# Patient Record
Sex: Female | Born: 2000 | Race: White | Hispanic: No | Marital: Single
Health system: Southern US, Community
[De-identification: ages and names within clinical notes are randomized; demographics above are authoritative.]

---
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
## 2020-12-08 ENCOUNTER — Emergency Department (HOSPITAL_COMMUNITY): Payer: No Typology Code available for payment source

## 2020-12-08 ENCOUNTER — Other Ambulatory Visit: Payer: Self-pay

## 2020-12-08 ENCOUNTER — Emergency Department (HOSPITAL_COMMUNITY)
Admission: EM | Admit: 2020-12-08 | Discharge: 2020-12-08 | Disposition: A | Discharge status: Home / Self Care | Payer: No Typology Code available for payment source | Attending: Emergency Medicine | Admitting: Emergency Medicine

## 2020-12-08 DIAGNOSIS — Y93K9 Activity, other involving animal care: Secondary | ICD-10-CM | POA: Insufficient documentation

## 2020-12-08 DIAGNOSIS — S61431A Puncture wound without foreign body of right hand, initial encounter: Secondary | ICD-10-CM | POA: Insufficient documentation

## 2020-12-08 DIAGNOSIS — W540XXA Bitten by dog, initial encounter: Secondary | ICD-10-CM | POA: Diagnosis not present

## 2020-12-08 DIAGNOSIS — Y990 Civilian activity done for income or pay: Secondary | ICD-10-CM | POA: Diagnosis not present

## 2020-12-08 DIAGNOSIS — S6991XA Unspecified injury of right wrist, hand and finger(s), initial encounter: Secondary | ICD-10-CM | POA: Diagnosis present

## 2020-12-08 MED ORDER — POVIDONE-IODINE 10 % EX SOLN
CUTANEOUS | Status: DC | PRN
  Filled 2020-12-08: qty 30

## 2020-12-08 MED ORDER — AMOXICILLIN-POT CLAVULANATE 875-125 MG PO TABS
1.0000 | ORAL_TABLET | Freq: Once | ORAL | Status: AC
  Administered 2020-12-08: 1 via ORAL
  Filled 2020-12-08: qty 1

## 2020-12-08 MED ORDER — AMOXICILLIN-POT CLAVULANATE 875-125 MG PO TABS: 1.0000 | ORAL_TABLET | Freq: Two times a day (BID) | ORAL | 0 refills | Status: AC

## 2020-12-08 NOTE — Discharge Instructions (Addendum)
Take the entire course of the antibiotics prescribed.  I recommend mild soapy water wash twice daily of your wounds followed by bandages to keep them clean and covered.  Also recommend ibuprofen or Tylenol for symptom relief as needed.

## 2020-12-08 NOTE — ED Triage Notes (Addendum)
Pt. Was bit by a dog at work. Pts. Right hand has a puncture wound in between the thumb and finger with bleeding controlled at this time. Pt. States dog has had the rabies vaccine.

## 2020-12-09 NOTE — ED Provider Notes (Signed)
[facility] EMERGENCY DEPARTMENT Provider Note   CSN: [id number] Arrival date & time: 12/08/20  1144     History Chief Complaint  Patient presents with   Animal Bite    Marilyn Thomas is a 20 y.o. female.  The history is provided by the patient.  Animal Bite Contact animal:  Dog Location:  Hand Hand injury location:  R hand (puncture wound between thumb and index finger web space) Pain details:    Quality:  Aching and localized   Severity:  Mild   Timing:  Constant   Progression:  Unchanged Provoked: provoked   Notifications:  Health department (Pt works as a Museum/gallery conservator, was bit when attempting to remove dog from an exam room back to his kennel.) Animal's rabies vaccination status:  Up to date Animal in possession: yes   Tetanus status:  Up to date Relieved by:  None tried Worsened by:  Nothing Ineffective treatments:  None tried Associated symptoms: no fever, no numbness and no swelling       No past medical history on file.  There are no problems to display for this patient.    The histories are not reviewed yet. Please review them in the "History" navigator section and refresh this SmartLink.   OB History   No obstetric history on file.     No family history on file.     Home Medications Prior to Admission medications   Medication Sig Start Date End Date Taking? Authorizing Provider  amoxicillin-clavulanate (AUGMENTIN) 875-125 MG tablet Take 1 tablet by mouth every 12 (twelve) hours. 12/08/20  Yes IdolRaynelle Fanning, PA-C    Allergies    Patient has no allergy information on record.  Review of Systems   Review of Systems  Constitutional:  Negative for chills and fever.  Respiratory: Negative.    Cardiovascular: Negative.   Skin:  Positive for wound.  Neurological:  Negative for weakness and numbness.   Physical Exam Updated Vital Signs BP 122/73   Pulse 71   Temp 98.7 F (37.1 C) (Oral)   Resp 18   Ht 5\' 3"  (1.6 m)   Wt 74.8 kg   SpO2 100%    BMI 29.23 kg/m   Physical Exam Constitutional:      Appearance: She is well-developed.  HENT:     Head: Normocephalic.  Cardiovascular:     Rate and Rhythm: Normal rate.  Pulmonary:     Effort: Pulmonary effort is normal.  Musculoskeletal:        General: No swelling or deformity.  Skin:    Findings: Laceration present. No erythema.     Comments: Small puncture wound right web space between thumb and index finger, hemostatic,  no palpable deformity/foreign body.  Several superficial abrasions around the wound site.   Neurological:     Mental Status: She is alert and oriented to person, place, and time.     Sensory: No sensory deficit.     Motor: No weakness.     Comments: No weakness in hand or fingers, FROM without deficit. Distal sensation intact with less than 2 sec cap refill in fingertips.    ED Results / Procedures / Treatments   Labs (all labs ordered are listed, but only abnormal results are displayed) Labs Reviewed - No data to display  EKG None  Radiology DG Hand Complete Right  Result Date: 12/08/2020 CLINICAL DATA:  Dog bite EXAM: RIGHT HAND - COMPLETE 3+ VIEW COMPARISON:  None. FINDINGS: There is no acute  fracture or dislocation. Alignment is normal. The joint spaces are preserved. The soft tissues are normal. There is no soft tissue gas. No radiopaque foreign body is seen. IMPRESSION: Normal hand radiographs. Electronically Signed   By: Lesia Hausen MD   On: 12/08/2020 15:23    Procedures Procedures   Medications Ordered in ED Medications  amoxicillin-clavulanate (AUGMENTIN) 875-125 MG per tablet 1 tablet (1 tablet Oral Given 12/08/20 1542)    ED Course  I have reviewed the triage vital signs and the nursing notes.  Pertinent labs & imaging results that were available during my care of the patient were reviewed by me and considered in my medical decision making (see chart for details).    MDM Rules/Calculators/A&P                           Imaging  reviewed with no fb or fx. Hand was soaked in betadine/saline solution, puncture flushed with saline.  No sutures indicated or recommended.  Discussed home care, dressings, augmentin started.  Pt is current with tetanus.  Strict return precautions and/or f/u with Dr. Romeo Apple for any complications with this wound as it heals. Final Clinical Impression(s) / ED Diagnoses Final diagnoses:  Dog bite, initial encounter    Rx / DC Orders ED Discharge Orders          Ordered    amoxicillin-clavulanate (AUGMENTIN) 875-125 MG tablet  Every 12 hours        12/08/20 1529             Victoriano Lain 12/09/20 1908    Pollyann Savoy, MD 12/10/20 1155

## 2022-01-02 IMAGING — DX DG HAND COMPLETE 3+V*R*
3 series · 3 of 3 positions shown · non-contrast
Comparison: None.

CLINICAL DATA: Dog bite

EXAM:
RIGHT HAND - COMPLETE 3+ VIEW

[hand pa]
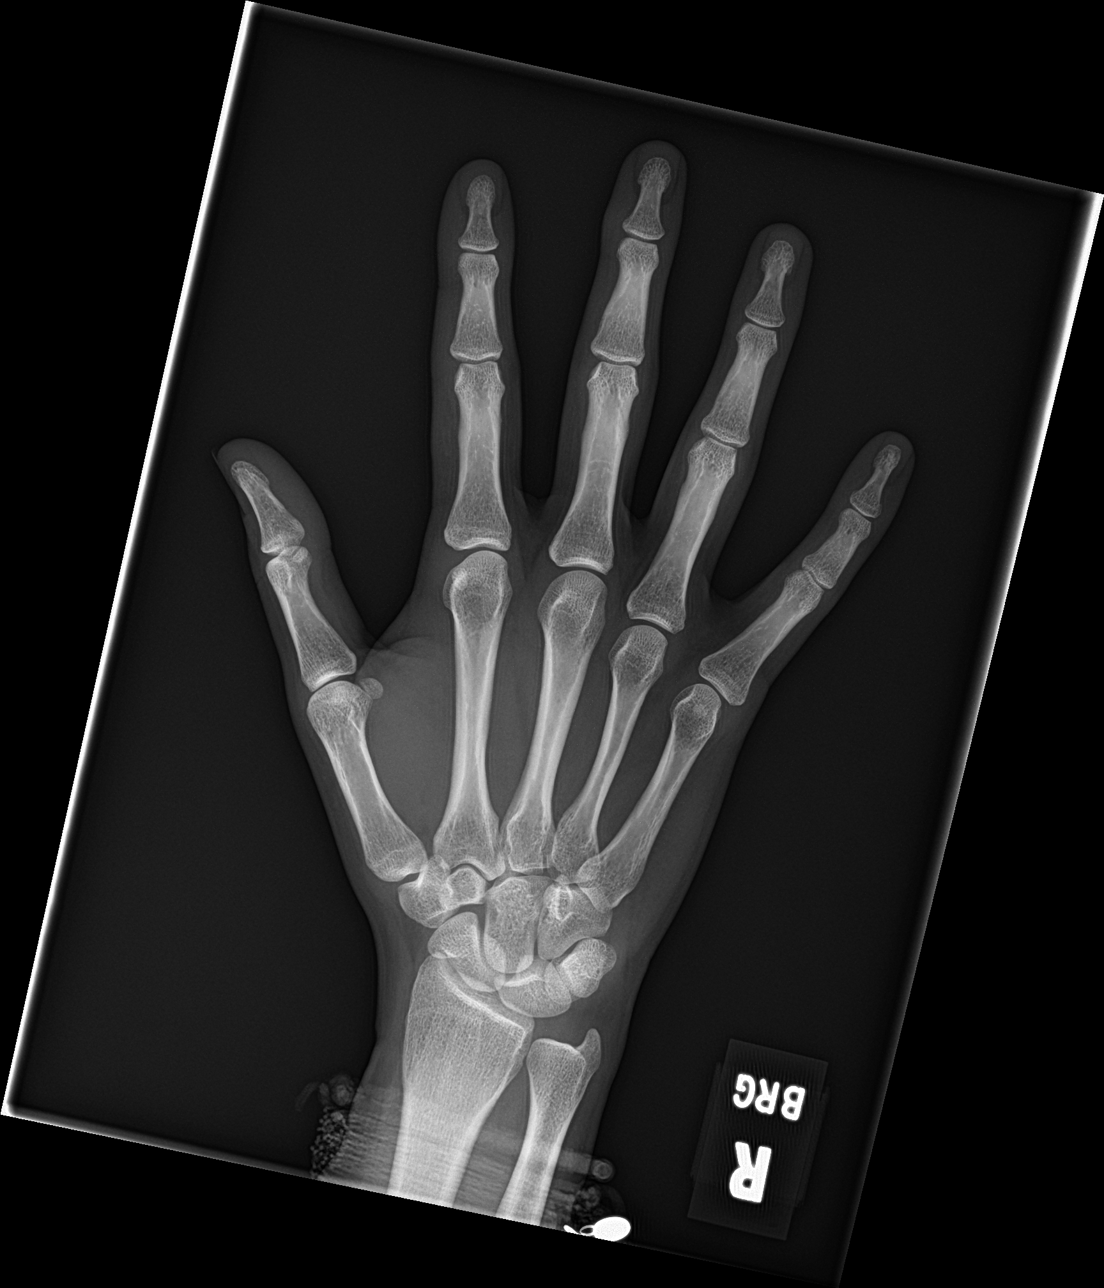

[hand obl]
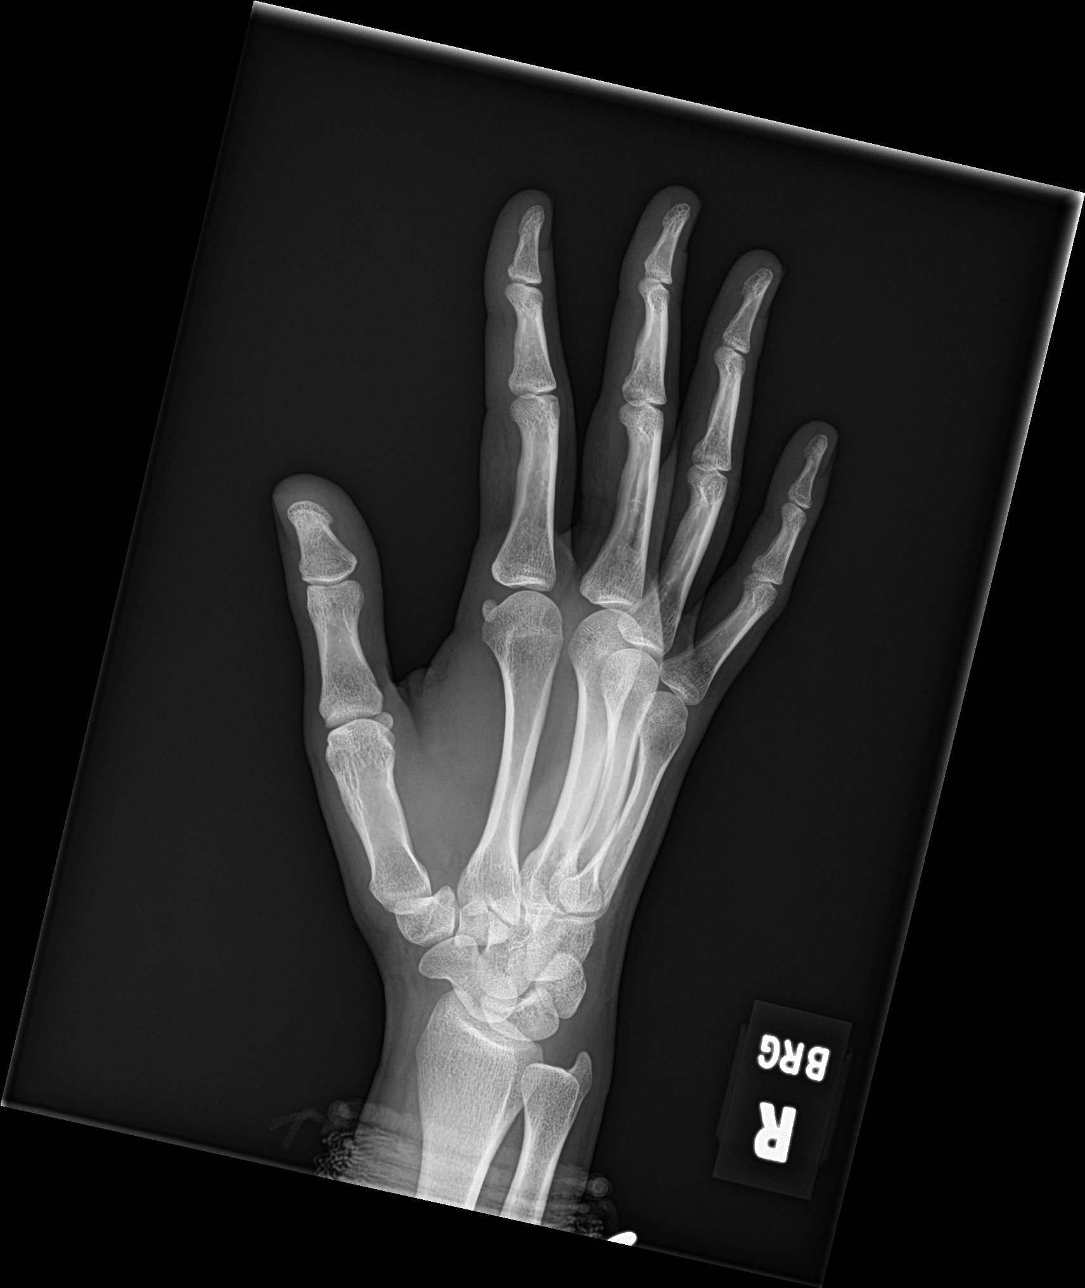

[hand lat]
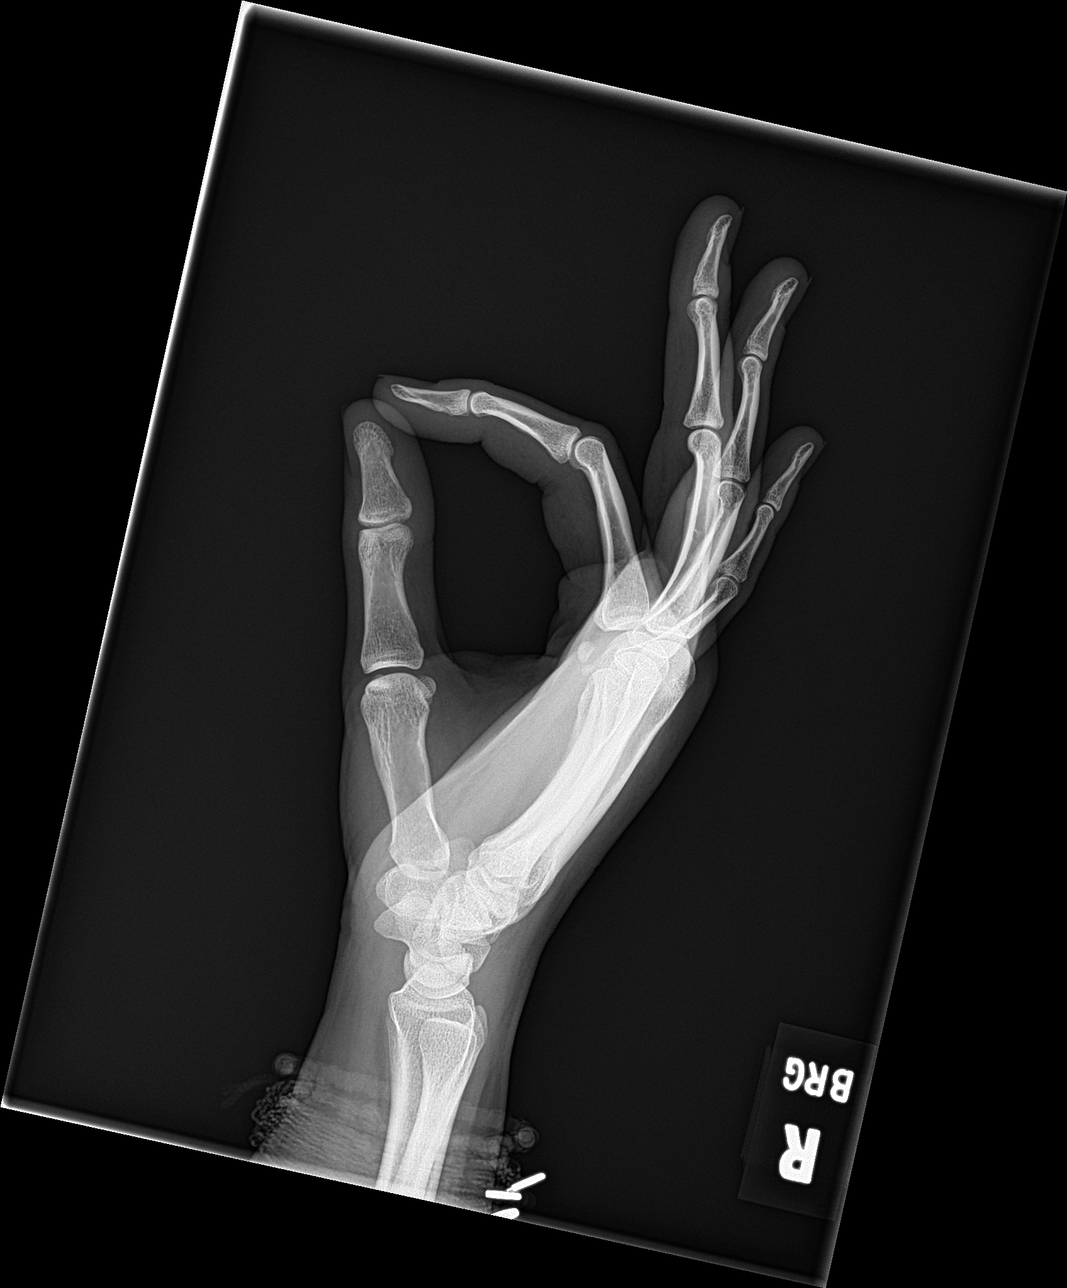

[3 of 3 positions shown; findings below may reference images not displayed]

FINDINGS: There is no acute fracture or dislocation. Alignment is normal. The
joint spaces are preserved. The soft tissues are normal. There is no
soft tissue gas. No radiopaque foreign body is seen.
IMPRESSION: Normal hand radiographs.
